# Patient Record
Sex: Female | Born: 1985 | Hispanic: No | Marital: Married | Smoking: Never smoker
Health system: Southern US, Community
[De-identification: ages and names within clinical notes are randomized; demographics above are authoritative.]

## PROBLEM LIST (undated history)

## (undated) DIAGNOSIS — L80 Vitiligo: Secondary | ICD-10-CM

## (undated) DIAGNOSIS — I10 Essential (primary) hypertension: Secondary | ICD-10-CM

## (undated) HISTORY — DX: Vitiligo: L80

---

## 2012-01-16 ENCOUNTER — Ambulatory Visit (INDEPENDENT_AMBULATORY_CARE_PROVIDER_SITE_OTHER): Payer: PRIVATE HEALTH INSURANCE | Admitting: Internal Medicine

## 2012-01-16 VITALS — BP 158/100 | HR 64 | Temp 98.0°F | Resp 16 | Ht 67.0 in | Wt 166.8 lb

## 2012-01-16 DIAGNOSIS — I10 Essential (primary) hypertension: Secondary | ICD-10-CM

## 2012-01-16 DIAGNOSIS — J029 Acute pharyngitis, unspecified: Secondary | ICD-10-CM

## 2012-01-16 MED ORDER — AZITHROMYCIN 250 MG PO TABS: ORAL_TABLET | ORAL | Status: AC

## 2012-01-16 MED ORDER — MAGIC MOUTHWASH W/LIDOCAINE: 5.0000 mL | Freq: Four times a day (QID) | ORAL | Status: DC | PRN
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
## 2012-01-19 LAB — CULTURE, GROUP A STREP: Organism ID, Bacteria: NORMAL

## 2013-08-15 ENCOUNTER — Other Ambulatory Visit: Payer: Self-pay | Admitting: Obstetrics & Gynecology

## 2013-10-10 ENCOUNTER — Other Ambulatory Visit: Payer: PRIVATE HEALTH INSURANCE | Admitting: Adult Health

## 2013-10-24 ENCOUNTER — Other Ambulatory Visit: Payer: PRIVATE HEALTH INSURANCE | Admitting: Adult Health

## 2014-08-07 ENCOUNTER — Other Ambulatory Visit: Payer: Self-pay | Admitting: Obstetrics & Gynecology

## 2020-04-14 ENCOUNTER — Emergency Department (HOSPITAL_COMMUNITY)
Admission: EM | Admit: 2020-04-14 | Discharge: 2020-04-14 | Disposition: A | Discharge status: Home / Self Care | Payer: Managed Care, Other (non HMO) | Attending: Emergency Medicine | Admitting: Emergency Medicine

## 2020-04-14 ENCOUNTER — Encounter (HOSPITAL_COMMUNITY): Payer: Self-pay | Admitting: Emergency Medicine

## 2020-04-14 ENCOUNTER — Emergency Department (HOSPITAL_COMMUNITY): Payer: Managed Care, Other (non HMO)

## 2020-04-14 ENCOUNTER — Other Ambulatory Visit: Payer: Self-pay

## 2020-04-14 DIAGNOSIS — Y999 Unspecified external cause status: Secondary | ICD-10-CM | POA: Diagnosis not present

## 2020-04-14 DIAGNOSIS — Z79899 Other long term (current) drug therapy: Secondary | ICD-10-CM | POA: Diagnosis not present

## 2020-04-14 DIAGNOSIS — M542 Cervicalgia: Secondary | ICD-10-CM | POA: Diagnosis not present

## 2020-04-14 DIAGNOSIS — Y92410 Unspecified street and highway as the place of occurrence of the external cause: Secondary | ICD-10-CM | POA: Diagnosis not present

## 2020-04-14 DIAGNOSIS — Y93I9 Activity, other involving external motion: Secondary | ICD-10-CM | POA: Diagnosis not present

## 2020-04-14 DIAGNOSIS — I10 Essential (primary) hypertension: Secondary | ICD-10-CM | POA: Diagnosis not present

## 2020-04-14 HISTORY — DX: Essential (primary) hypertension: I10

## 2020-04-14 MED ORDER — NAPROXEN 500 MG PO TABS: 500.0000 mg | ORAL_TABLET | Freq: Two times a day (BID) | ORAL | 0 refills | Status: DC

## 2020-04-14 MED ORDER — METHOCARBAMOL 500 MG PO TABS: 500.0000 mg | ORAL_TABLET | Freq: Two times a day (BID) | ORAL | 0 refills | Status: DC
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
## 2020-04-14 NOTE — Discharge Instructions (Signed)
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
## 2020-11-30 DIAGNOSIS — C4490 Unspecified malignant neoplasm of skin, unspecified: Secondary | ICD-10-CM

## 2020-11-30 HISTORY — DX: Unspecified malignant neoplasm of skin, unspecified: C44.90

## 2022-01-23 ENCOUNTER — Other Ambulatory Visit: Payer: Self-pay

## 2022-01-23 ENCOUNTER — Ambulatory Visit
Admission: EM | Admit: 2022-01-23 | Discharge: 2022-01-23 | Disposition: A | Discharge status: Home / Self Care | Payer: Commercial Managed Care - PPO | Attending: Family Medicine | Admitting: Family Medicine

## 2022-01-23 ENCOUNTER — Encounter: Payer: Self-pay | Admitting: Emergency Medicine

## 2022-01-23 DIAGNOSIS — J029 Acute pharyngitis, unspecified: Secondary | ICD-10-CM | POA: Diagnosis not present

## 2022-01-23 DIAGNOSIS — R5383 Other fatigue: Secondary | ICD-10-CM | POA: Diagnosis present

## 2022-01-23 LAB — POCT RAPID STREP A (OFFICE): Rapid Strep A Screen: NEGATIVE

## 2022-01-23 MED ORDER — LIDOCAINE VISCOUS HCL 2 % MT SOLN: 10.0000 mL | OROMUCOSAL | 0 refills | Status: DC | PRN

## 2022-01-23 NOTE — ED Provider Notes (Signed)
[facility]    CSN: [id number] Arrival date & time: 01/23/22  0831      History   Chief Complaint No chief complaint on file.   HPI Haley Skinner is a 36 y.o. female.   Presenting today with 3 to 4-day history of sore, swollen feeling throat, fatigue, nasal drainage and also saw white spots in the back of her throat since yesterday.  Denies fever, chills, dysphagia, cough, chest pain, shortness of breath, abdominal pain, nausea vomiting or diarrhea.  So far trying DayQuil, Flonase, ibuprofen with minimal temporary relief of symptoms.  Multiple sick contacts recently but unsure with what.  Had COVID about 3 weeks ago.  No known pertinent chronic medical problems.   Past Medical History:  Diagnosis Date   Hypertension     There are no problems to display for this patient.   Past Surgical History:  Procedure Laterality Date   CESAREAN SECTION      OB History   No obstetric history on file.      Home Medications    Prior to Admission medications   Medication Sig Start Date End Date Taking? Authorizing Provider  labetalol (NORMODYNE) 100 MG tablet Take 100 mg by mouth 2 (two) times daily.   Yes [provider]  lidocaine (XYLOCAINE) 2 % solution Use as directed 10 mLs in the mouth or throat every 3 (three) hours as needed for mouth pain. 01/23/22  Yes Volney American, PA-C  montelukast (SINGULAIR) 10 MG tablet Take 10 mg by mouth at bedtime.   Yes [provider]  Alum & Mag Hydroxide-Simeth (MAGIC MOUTHWASH W/LIDOCAINE) SOLN Take 5 mLs by mouth 4 (four) times daily as needed. 01/16/12   Dunn, Areta Haber, PA-C  methocarbamol (ROBAXIN) 500 MG tablet Take 1 tablet (500 mg total) by mouth 2 (two) times daily. 04/14/20   Jacqlyn Larsen, PA-C  naproxen (NAPROSYN) 500 MG tablet Take 1 tablet (500 mg total) by mouth 2 (two) times daily. 04/14/20   Jacqlyn Larsen, PA-C  Norgestimate-Ethinyl Estradiol Triphasic 0.18/0.215/0.25 MG-25 MCG tab Take 1  tablet by mouth daily.    [provider]  [place] 28 0.25-35 MG-MCG tablet TAKE BY MOUTH AS DIRECTED 08/08/14   Florian Buff, MD    Family History History reviewed. No pertinent family history.  Social History Social History   Tobacco Use   Smoking status: Never   Smokeless tobacco: Never  Substance Use Topics   Alcohol use: Yes    Comment: socially   Drug use: Never     Allergies   Amoxicillin   Review of Systems Review of Systems Per HPI  Physical Exam Triage Vital Signs ED Triage Vitals  Enc Vitals Group     BP 01/23/22 0837 130/81     Pulse Rate 01/23/22 0837 77     Resp 01/23/22 0837 18     Temp 01/23/22 0837 98.2 F (36.8 C)     Temp Source 01/23/22 0837 Oral     SpO2 01/23/22 0837 98 %     Weight --      Height --      Head Circumference --      Peak Flow --      Pain Score 01/23/22 0838 5     Pain Loc --      Pain Edu? --      Excl. in [place]? --    No data found.  Updated Vital Signs BP 130/81 (BP Location: Right  Arm)    Pulse 77    Temp 98.2 F (36.8 C) (Oral)    Resp 18    LMP 01/08/2022 (Exact Date)    SpO2 98%   Visual Acuity Right Eye Distance:   Left Eye Distance:   Bilateral Distance:    Right Eye Near:   Left Eye Near:    Bilateral Near:     Physical Exam Vitals and nursing note reviewed.  Constitutional:      Appearance: Normal appearance.  HENT:     Head: Atraumatic.     Right Ear: Tympanic membrane and external ear normal.     Left Ear: Tympanic membrane and external ear normal.     Nose: Nose normal.     Mouth/Throat:     Mouth: Mucous membranes are moist.     Pharynx: Posterior oropharyngeal erythema present. No oropharyngeal exudate.     Comments: Mild oropharyngeal edema, erythema. No obvious exudates. Uvula midline. Oral airway patent Eyes:     Extraocular Movements: Extraocular movements intact.     Conjunctiva/sclera: Conjunctivae normal.  Cardiovascular:     Rate and Rhythm: Normal rate and regular  rhythm.     Heart sounds: Normal heart sounds.  Pulmonary:     Effort: Pulmonary effort is normal.     Breath sounds: Normal breath sounds. No wheezing.  Musculoskeletal:        General: Normal range of motion.     Cervical back: Normal range of motion and neck supple.  Skin:    General: Skin is warm and dry.  Neurological:     Mental Status: She is alert and oriented to person, place, and time.  Psychiatric:        Mood and Affect: Mood normal.        Thought Content: Thought content normal.   UC Treatments / Results  Labs (all labs ordered are listed, but only abnormal results are displayed) Labs Reviewed  CULTURE, GROUP A STREP [facility])  POCT RAPID STREP A (OFFICE)    EKG   Radiology No results found.  Procedures Procedures (including critical care time)  Medications Ordered in UC Medications - No data to display  Initial Impression / Assessment and Plan / UC Course  I have reviewed the triage vital signs and the nursing notes.  Pertinent labs & imaging results that were available during my care of the patient were reviewed by me and considered in my medical decision making (see chart for details).     Vitals and exam overall reassuring and suspicious for a viral pharyngitis.  Rapid strep negative, throat culture pending.  She was viscous lidocaine, supportive over-the-counter medications and home care.  Work note given.  Return for any acutely worsening symptoms.  Final Clinical Impressions(s) / UC Diagnoses   Final diagnoses:  Sore throat  Other fatigue   Discharge Instructions   None    ED Prescriptions     Medication Sig Dispense Auth. Provider   lidocaine (XYLOCAINE) 2 % solution Use as directed 10 mLs in the mouth or throat every 3 (three) hours as needed for mouth pain. 100 mL Volney American, [place]      PDMP not reviewed this encounter.   Merrie Roof [place], [place] 01/23/22 [phone]

## 2022-01-23 NOTE — ED Triage Notes (Signed)
Sore throat since Tuesday.  Fatigue and some nasal drainage.  States she saw white spots on back of throat.

## 2022-01-26 LAB — CULTURE, GROUP A STREP (THRC)

## 2022-06-04 ENCOUNTER — Encounter: Payer: Self-pay | Admitting: Obstetrics & Gynecology

## 2022-06-04 ENCOUNTER — Ambulatory Visit (INDEPENDENT_AMBULATORY_CARE_PROVIDER_SITE_OTHER): Payer: Commercial Managed Care - PPO | Admitting: Obstetrics & Gynecology

## 2022-06-04 VITALS — Ht 66.0 in | Wt 183.0 lb

## 2022-06-04 DIAGNOSIS — N920 Excessive and frequent menstruation with regular cycle: Secondary | ICD-10-CM

## 2022-06-04 DIAGNOSIS — N946 Dysmenorrhea, unspecified: Secondary | ICD-10-CM

## 2022-06-04 NOTE — Progress Notes (Signed)
Chief Complaint  Patient presents with   Heavy bleeding with clots      36 y.o. G2P0010 Patient's last menstrual period was 05/19/2022. The current method of family planning is none.  Outpatient Encounter Medications as of 06/04/2022  Medication Sig   labetalol (NORMODYNE) 200 MG tablet Take 200 mg by mouth 2 (two) times daily.   montelukast (SINGULAIR) 10 MG tablet Take 10 mg by mouth at bedtime.   lidocaine (XYLOCAINE) 2 % solution Use as directed 10 mLs in the mouth or throat every 3 (three) hours as needed for mouth pain. (Patient not taking: Reported on 06/04/2022)   naproxen (NAPROSYN) 500 MG tablet Take 1 tablet (500 mg total) by mouth 2 (two) times daily. (Patient not taking: Reported on 06/04/2022)   [DISCONTINUED] Alum & Mag Hydroxide-Simeth (MAGIC MOUTHWASH W/LIDOCAINE) SOLN Take 5 mLs by mouth 4 (four) times daily as needed. (Patient not taking: Reported on 06/04/2022)   [DISCONTINUED] labetalol (NORMODYNE) 100 MG tablet Take 100 mg by mouth 2 (two) times daily. (Patient not taking: Reported on 06/04/2022)   [DISCONTINUED] methocarbamol (ROBAXIN) 500 MG tablet Take 1 tablet (500 mg total) by mouth 2 (two) times daily. (Patient not taking: Reported on 06/04/2022)   [DISCONTINUED] Norgestimate-Ethinyl Estradiol Triphasic 0.18/0.215/0.25 MG-25 MCG tab Take 1 tablet by mouth daily. (Patient not taking: Reported on 06/04/2022)   [DISCONTINUED] SPRINTEC 28 0.25-35 MG-MCG tablet TAKE BY MOUTH AS DIRECTED (Patient not taking: Reported on 06/04/2022)   No facility-administered encounter medications on file as of 06/04/2022.    Subjective Pt with increasingly worsening periods, both in volume and disruptive cramping Still lasts the same time but >50% volume and cramping as compared baseline No other associated issues Past Medical History:  Diagnosis Date   Hypertension    Skin cancer 2022   basal cell, actinic keratosis   Vitiligo     Past Surgical History:  Procedure Laterality Date    CESAREAN SECTION      OB History     Gravida  2   Para      Term      Preterm      AB  1   Living         SAB  1   IAB      Ectopic      Multiple      Live Births              Allergies  Allergen Reactions   Amoxicillin Hives    Social History   Socioeconomic History   Marital status: Married    Spouse name: Not on file   Number of children: Not on file   Years of education: Not on file   Highest education level: Not on file  Occupational History   Not on file  Tobacco Use   Smoking status: Never   Smokeless tobacco: Never  Vaping Use   Vaping Use: Never used  Substance and Sexual Activity   Alcohol use: Yes    Comment: socially   Drug use: Never   Sexual activity: Yes    Birth control/protection: None, Condom  Other Topics Concern   Not on file  Social History Narrative   Not on file   Social Determinants of Health   Financial Resource Strain: Low Risk  (06/04/2022)   Overall Financial Resource Strain (CARDIA)    Difficulty of Paying Living Expenses: Not very hard  Food Insecurity: No Food Insecurity (06/04/2022)   Hunger Vital  Sign    Worried About Charity fundraiser in the Last Year: Never true    [place] in the Last Year: Never true  Transportation Needs: No Transportation Needs (06/04/2022)   PRAPARE - Hydrologist (Medical): No    Lack of Transportation (Non-Medical): No  Physical Activity: Sufficiently Active (06/04/2022)   Exercise Vital Sign    Days of Exercise per Week: 3 days    Minutes of Exercise per Session: 60 min  Stress: No Stress Concern Present (06/04/2022)   [place]    Feeling of Stress : Only a little  Social Connections: Socially Integrated (06/04/2022)   Social Connection and Isolation Panel [NHANES]    Frequency of Communication with Friends and Family: Twice a week    Frequency of Social Gatherings with  Friends and Family: Twice a week    Attends Religious Services: More than 4 times per year    Active Member of [employer] or Organizations: Yes    Attends Archivist Meetings: 1 to 4 times per year    Marital Status: Married    Family History  Problem Relation Age of Onset   Hypertension Paternal Grandfather    Alcoholism Paternal Grandfather    Alcoholism Paternal Grandmother    Diabetes Maternal Grandmother    Liver disease Maternal Grandmother    Bladder Cancer Maternal Grandfather    Hypertension Father    Hyperlipidemia Father    Hyperlipidemia Mother    Vasculitis Mother    Gestational diabetes Mother     Medications:       Current Outpatient Medications:    labetalol (NORMODYNE) 200 MG tablet, Take 200 mg by mouth 2 (two) times daily., Disp: , Rfl:    montelukast (SINGULAIR) 10 MG tablet, Take 10 mg by mouth at bedtime., Disp: , Rfl:    lidocaine (XYLOCAINE) 2 % solution, Use as directed 10 mLs in the mouth or throat every 3 (three) hours as needed for mouth pain. (Patient not taking: Reported on 06/04/2022), Disp: 100 mL, Rfl: 0   naproxen (NAPROSYN) 500 MG tablet, Take 1 tablet (500 mg total) by mouth 2 (two) times daily. (Patient not taking: Reported on 06/04/2022), Disp: 30 tablet, Rfl: 0  Objective Height '5\' 6"'$  (1.676 m), weight 183 lb (83 kg), last menstrual period 05/19/2022.  General WDWN female NAD Vulva:  normal appearing vulva with no masses, tenderness or lesions Vagina:  normal mucosa, no discharge Cervix:  Normal no lesions Uterus:  normal size, contour, position, consistency, mobility, non-tender Adnexa: ovaries:present,  normal adnexa in size, nontender and no masses   Pertinent ROS No burning with urination, frequency or urgency No nausea, vomiting or diarrhea Nor fever chills or other constitutional symptoms   Labs or studies     Impression + Management Plan: Diagnoses this Encounter::   ICD-10-CM   1. Menorrhagia with regular cycle   N92.0 POCT hemoglobin   worsened over the past <12 months, large clots requiring tampons pads and soils, hemoglobin 13, POP pills for cycle management    2. Dysmenorrhea  N94.6         Medications prescribed during  this encounter: No orders of the defined types were placed in this encounter.   Labs or Scans Ordered during this encounter: Orders Placed This Encounter  Procedures   POCT hemoglobin      Follow up Return if symptoms worsen or fail to improve, for  contact regarding response.

## 2022-06-05 LAB — POCT HEMOGLOBIN: Hemoglobin: 13 g/dL (ref 11–14.6)

## 2022-07-16 ENCOUNTER — Encounter: Payer: Self-pay | Admitting: Obstetrics & Gynecology

## 2022-08-24 ENCOUNTER — Telehealth: Payer: Self-pay | Admitting: *Deleted

## 2022-08-24 MED ORDER — SLYND 4 MG PO TABS
1.0000 | ORAL_TABLET | Freq: Every day | ORAL | 12 refills | Status: DC
Start: 2022-08-24 — End: 2022-12-01

## 2022-08-24 NOTE — Telephone Encounter (Signed)
Pt needs a prescription of Slynd sent to pharmacy. Thanks! Gratz

## 2022-09-15 ENCOUNTER — Telehealth: Payer: Self-pay | Admitting: *Deleted

## 2022-09-15 MED ORDER — SLYND 4 MG PO TABS: 1.0000 | ORAL_TABLET | Freq: Every day | ORAL | 12 refills | Status: DC

## 2022-09-15 NOTE — Telephone Encounter (Signed)
Slynd is not covered by patients insurance. Covered options are Camila, Errin, Heather, Norethindrone, [place]. Please send new rx.

## 2022-11-08 ENCOUNTER — Encounter: Payer: Self-pay | Admitting: Obstetrics & Gynecology

## 2022-11-26 ENCOUNTER — Other Ambulatory Visit: Payer: Self-pay | Admitting: Obstetrics & Gynecology

## 2022-11-26 DIAGNOSIS — N921 Excessive and frequent menstruation with irregular cycle: Secondary | ICD-10-CM

## 2022-12-01 ENCOUNTER — Encounter: Payer: Self-pay | Admitting: Obstetrics & Gynecology

## 2022-12-01 ENCOUNTER — Ambulatory Visit (INDEPENDENT_AMBULATORY_CARE_PROVIDER_SITE_OTHER): Payer: Commercial Managed Care - PPO

## 2022-12-01 ENCOUNTER — Ambulatory Visit (INDEPENDENT_AMBULATORY_CARE_PROVIDER_SITE_OTHER): Payer: Commercial Managed Care - PPO | Admitting: Obstetrics & Gynecology

## 2022-12-01 VITALS — BP 144/92 | HR 67 | Ht 66.0 in | Wt 183.5 lb

## 2022-12-01 DIAGNOSIS — N946 Dysmenorrhea, unspecified: Secondary | ICD-10-CM | POA: Diagnosis not present

## 2022-12-01 DIAGNOSIS — N920 Excessive and frequent menstruation with regular cycle: Secondary | ICD-10-CM | POA: Diagnosis not present

## 2022-12-01 DIAGNOSIS — N921 Excessive and frequent menstruation with irregular cycle: Secondary | ICD-10-CM | POA: Diagnosis not present

## 2022-12-01 MED ORDER — TRANEXAMIC ACID 650 MG PO TABS: 1300.0000 mg | ORAL_TABLET | Freq: Three times a day (TID) | ORAL | 11 refills | Status: DC

## 2022-12-01 NOTE — Progress Notes (Signed)
PELVIC US TA/TV: homogeneous anteverted uterus,WNL,EEC 8 mm,normal right ovary,(#1)simple unilocular left ovarian cyst 2.5 x 2.4 x 1.9 cm,(#2) small hemorrhagic cyst 1.7 x 1.4 x 1.3 cm,ovaries appear mobile,no free fluid,left adnexal discomfort during ultrasound  Chaperone Peggy

## 2022-12-01 NOTE — Progress Notes (Signed)
Follow up appointment for results: Sonogram, repsonse to slynd(POP)  Chief Complaint  Patient presents with   Follow-up    Discuss [place] results    Blood pressure (!) 144/92, pulse 67, height '5\' 6"'$  (1.676 m), weight 183 lb 8 oz (83.2 kg), last menstrual period 11/28/2022.  US PELVIC COMPLETE WITH TRANSVAGINAL  Result Date: 12/01/2022  .Marland Kitchenan Sales executive of Ultrasound Medicine Diplomatic Services operational officer) accredited practice Center for [facility] @ [address] [place] [address] Ordering Provider: Florian Buff, MD                                                                                                                                   GYNECOLOGIC SONOGRAM Haley Skinner is a 37 y.o. G2P0010 No LMP recorded. for a pelvic sonogram for menorrhagia. Uterus                      7.2 x 4.2 x 6 cm, Total uterine volume 94 cc, homogeneous anteverted uterus,WNL Endometrium          8 mm, symmetrical, wnl Right ovary             2.7 x 1.1 x 2.1 cm, wnl Left ovary                3.1 x 3.8 x 3.3 cm, (#1)simple unilocular left ovarian cyst 2.5 x 2.4 x 1.9 cm,(#2) small hemorrhagic lt ov cyst 1.7 x 1.4 x 1.3 cm No free fluid Technician Comments: PELVIC US TA/TV: homogeneous anteverted uterus,WNL,EEC 8 mm,normal right ovary,(#1)simple unilocular left ovarian cyst 2.5 x 2.4 x 1.9 cm,(#2) small hemorrhagic left ovarian cyst 1.7 x 1.4 x 1.3 cm,ovaries appear mobile,no free fluid,left adnexal discomfort during ultrasound Chaperone [address] Heide Guile 12/01/2022 9:54 AM Clinical Impression and recommendations: I have reviewed the sonogram results above, combined with the patient's current clinical course, below are my impressions and any appropriate recommendations for management based on the sonographic findings. Uterus normal size shape and contour Endometrium thin symmetrical no pathology Ovaries: physiological cysts noted, otherwise, benign, normal size shape and morpholog Normal Pelvic sonogram  Florian Buff 12/01/2022 10:34 AM     3 weeks bleeding with slynd + progestational side effects Has significant BP issues so COC not an option I do not think Discussed IUD(does not want), ablation(would like to defer if possible) no other POP/only method would be acceptable given her response to slynd  Will try lysteda + naproxen sodium at the time of cycle  MEDS ordered this encounter: Meds ordered this encounter  Medications   tranexamic acid (LYSTEDA) 650 MG TABS tablet    Sig: Take 2 tablets (1,300 mg total) by mouth 3 (three) times daily.    Dispense:  30 tablet    Refill:  11    Orders for this encounter: No orders of the defined types were placed in this encounter.  Impression + Management Plan   ICD-10-CM   1. Menorrhagia with regular cycle  N92.0     2. Dysmenorrhea  N94.6       Follow Up: Return if symptoms worsen or fail to improve, for Haley Skinner is in her court, send me MyChart with response.     All questions were answered.  Past Medical History:  Diagnosis Date   Hypertension    Skin cancer 2022   basal cell, actinic keratosis   Vitiligo     Past Surgical History:  Procedure Laterality Date   CESAREAN SECTION      OB History     Gravida  2   Para  1   Term  1   Preterm      AB  1   Living  1      SAB  1   IAB      Ectopic      Multiple      Live Births  1           Allergies  Allergen Reactions   Amoxicillin Hives    Social History   Socioeconomic History   Marital status: Married    Spouse name: Not on file   Number of children: Not on file   Years of education: Not on file   Highest education level: Not on file  Occupational History   Not on file  Tobacco Use   Smoking status: Never   Smokeless tobacco: Never  Vaping Use   Vaping Use: Never used  Substance and Sexual Activity   Alcohol use: Yes    Comment: socially   Drug use: Never   Sexual activity: Yes    Birth control/protection: None, Condom   Other Topics Concern   Not on file  Social History Narrative   Not on file   Social Determinants of Health   Financial Resource Strain: Low Risk  (06/04/2022)   Overall Financial Resource Strain (CARDIA)    Difficulty of Paying Living Expenses: Not very hard  Food Insecurity: No Food Insecurity (06/04/2022)   Hunger Vital Sign    Worried About Running Out of Food in the Last Year: Never true    Ran Out of Food in the Last Year: Never true  Transportation Needs: No Transportation Needs (06/04/2022)   PRAPARE - Hydrologist (Medical): No    Lack of Transportation (Non-Medical): No  Physical Activity: Sufficiently Active (06/04/2022)   Exercise Vital Sign    Days of Exercise per Week: 3 days    Minutes of Exercise per Session: 60 min  Stress: No Stress Concern Present (06/04/2022)   [place]    Feeling of Stress : Only a little  Social Connections: Socially Integrated (06/04/2022)   Social Connection and Isolation Panel [NHANES]    Frequency of Communication with Friends and Family: Twice a week    Frequency of Social Gatherings with Friends and Family: Twice a week    Attends Religious Services: More than 4 times per year    Active Member of [employer] or Organizations: Yes    Attends Archivist Meetings: 1 to 4 times per year    Marital Status: Married    Family History  Problem Relation Age of Onset   Hypertension Paternal Grandfather    Alcoholism Paternal Grandfather    Alcoholism Paternal Grandmother    Diabetes Maternal Grandmother    Liver disease Maternal Grandmother  Bladder Cancer Maternal Grandfather    Hypertension Father    Hyperlipidemia Father    Hyperlipidemia Mother    Vasculitis Mother    Gestational diabetes Mother

## 2023-05-11 ENCOUNTER — Emergency Department (HOSPITAL_COMMUNITY)
Admission: EM | Admit: 2023-05-11 | Discharge: 2023-05-12 | Disposition: A | Discharge status: Home / Self Care | Payer: Commercial Managed Care - PPO | Attending: Emergency Medicine | Admitting: Emergency Medicine

## 2023-05-11 ENCOUNTER — Encounter (HOSPITAL_COMMUNITY): Payer: Self-pay

## 2023-05-11 ENCOUNTER — Other Ambulatory Visit: Payer: Self-pay

## 2023-05-11 ENCOUNTER — Emergency Department (HOSPITAL_COMMUNITY): Payer: Commercial Managed Care - PPO

## 2023-05-11 DIAGNOSIS — N12 Tubulo-interstitial nephritis, not specified as acute or chronic: Secondary | ICD-10-CM | POA: Insufficient documentation

## 2023-05-11 DIAGNOSIS — I10 Essential (primary) hypertension: Secondary | ICD-10-CM | POA: Insufficient documentation

## 2023-05-11 DIAGNOSIS — Z85828 Personal history of other malignant neoplasm of skin: Secondary | ICD-10-CM | POA: Insufficient documentation

## 2023-05-11 DIAGNOSIS — R109 Unspecified abdominal pain: Secondary | ICD-10-CM | POA: Diagnosis present

## 2023-05-11 LAB — CBC
HCT: 38.1 % (ref 36.0–46.0)
Hemoglobin: 13.5 g/dL (ref 12.0–15.0)
MCH: 30.3 pg (ref 26.0–34.0)
MCHC: 35.4 g/dL (ref 30.0–36.0)
MCV: 85.4 fL (ref 80.0–100.0)
Platelets: 172 10*3/uL (ref 150–400)
RBC: 4.46 MIL/uL (ref 3.87–5.11)
RDW: 12.2 % (ref 11.5–15.5)
WBC: 10.9 10*3/uL — ABNORMAL HIGH (ref 4.0–10.5)
nRBC: 0 % (ref 0.0–0.2)

## 2023-05-11 LAB — COMPREHENSIVE METABOLIC PANEL
ALT: 17 U/L (ref 0–44)
AST: 17 U/L (ref 15–41)
Albumin: 4.2 g/dL (ref 3.5–5.0)
Alkaline Phosphatase: 54 U/L (ref 38–126)
Anion gap: 7 (ref 5–15)
BUN: 14 mg/dL (ref 6–20)
CO2: 25 mmol/L (ref 22–32)
Calcium: 8.9 mg/dL (ref 8.9–10.3)
Chloride: 102 mmol/L (ref 98–111)
Creatinine, Ser: 0.79 mg/dL (ref 0.44–1.00)
GFR, Estimated: >60 mL/min (ref >60)
Glucose, Bld: 116 mg/dL — ABNORMAL HIGH (ref 70–99)
Potassium: 3.6 mmol/L (ref 3.5–5.1)
Sodium: 134 mmol/L — ABNORMAL LOW (ref 135–145)
Total Bilirubin: 0.5 mg/dL (ref 0.3–1.2)
Total Protein: 7.1 g/dL (ref 6.5–8.1)

## 2023-05-11 LAB — POC URINE PREG, ED: Preg Test, Ur: NEGATIVE

## 2023-05-11 LAB — LIPASE, BLOOD: Lipase: 46 U/L (ref 11–51)

## 2023-05-11 MED ORDER — IOHEXOL 300 MG/ML  SOLN
100.0000 mL | Freq: Once | INTRAMUSCULAR | Status: AC | PRN
  Administered 2023-05-12: 100 mL via INTRAVENOUS

## 2023-05-11 NOTE — ED Triage Notes (Signed)
Pt complaining of right flank pain x1 day that has gotten worse. Pt endorses nausea. Pt has had 2 recent UTI's in the last 5 weeks. Denies fevers.

## 2023-05-12 LAB — URINALYSIS, ROUTINE W REFLEX MICROSCOPIC
Bilirubin Urine: NEGATIVE
Glucose, UA: NEGATIVE mg/dL
Ketones, ur: NEGATIVE mg/dL
Nitrite: NEGATIVE
Protein, ur: 100 mg/dL — AB
RBC / HPF: >50 RBC/hpf (ref 0–5)
Specific Gravity, Urine: 1.012 (ref 1.005–1.030)
WBC, UA: >50 WBC/hpf (ref 0–5)
pH: 7 (ref 5.0–8.0)

## 2023-05-12 MED ORDER — CIPROFLOXACIN HCL 250 MG PO TABS
500.0000 mg | ORAL_TABLET | Freq: Once | ORAL | Status: AC
  Administered 2023-05-12: 500 mg via ORAL
  Filled 2023-05-12: qty 2

## 2023-05-12 MED ORDER — CIPROFLOXACIN HCL 500 MG PO TABS: 500.0000 mg | ORAL_TABLET | Freq: Two times a day (BID) | ORAL | 0 refills | Status: AC

## 2023-05-12 NOTE — ED Provider Notes (Signed)
[facility]-EMERGENCY DEPT [facility] Emergency Department Provider Note MRN:  [id number]  Arrival date & time: 05/12/23     Chief Complaint   Flank pain History of Present Illness   Haley Skinner is a 37 y.o. year-old female with a history of hypertension presenting to the ED with chief complaint of flank pain.  Pain in the right flank for the past few days.  Has had multiple UTIs over the past few weeks.  Denies fever, pain was pretty significant at home but improved with Motrin.  Review of Systems  A thorough review of systems was obtained and all systems are negative except as noted in the HPI and PMH.   Patient's Health History    Past Medical History:  Diagnosis Date   Hypertension    Skin cancer 2022   basal cell, actinic keratosis   Vitiligo     Past Surgical History:  Procedure Laterality Date   CESAREAN SECTION      Family History  Problem Relation Age of Onset   Hypertension Paternal Grandfather    Alcoholism Paternal Grandfather    Alcoholism Paternal Grandmother    Diabetes Maternal Grandmother    Liver disease Maternal Grandmother    Bladder Cancer Maternal Grandfather    Hypertension Father    Hyperlipidemia Father    Hyperlipidemia Mother    Vasculitis Mother    Gestational diabetes Mother     Social History   Socioeconomic History   Marital status: Married    Spouse name: Not on file   Number of children: Not on file   Years of education: Not on file   Highest education level: Not on file  Occupational History   Not on file  Tobacco Use   Smoking status: Never   Smokeless tobacco: Never  Vaping Use   Vaping Use: Never used  Substance and Sexual Activity   Alcohol use: Yes    Comment: socially   Drug use: Never   Sexual activity: Yes    Birth control/protection: None, Condom  Other Topics Concern   Not on file  Social History Narrative   Not on file   Social Determinants of Health   Financial Resource Strain: Low Risk   (06/04/2022)   Overall Financial Resource Strain (CARDIA)    Difficulty of Paying Living Expenses: Not very hard  Food Insecurity: No Food Insecurity (06/04/2022)   Hunger Vital Sign    Worried About Running Out of Food in the Last Year: Never true    Ran Out of Food in the Last Year: Never true  Transportation Needs: No Transportation Needs (06/04/2022)   PRAPARE - Administrator, Civil Service (Medical): No    Lack of Transportation (Non-Medical): No  Physical Activity: Sufficiently Active (06/04/2022)   Exercise Vital Sign    Days of Exercise per Week: 3 days    Minutes of Exercise per Session: 60 min  Stress: No Stress Concern Present (06/04/2022)   [employer] of Occupational Health - Occupational Stress Questionnaire    Feeling of Stress : Only a little  Social Connections: Socially Integrated (06/04/2022)   Social Connection and Isolation Panel [NHANES]    Frequency of Communication with Friends and Family: Twice a week    Frequency of Social Gatherings with Friends and Family: Twice a week    Attends Religious Services: More than 4 times per year    Active Member of [employer] or Organizations: Yes    Attends Banker Meetings: 1 to  4 times per year    Marital Status: Married  Catering manager Violence: Not At Risk (06/04/2022)   Humiliation, Afraid, Rape, and Kick questionnaire    Fear of Current or Ex-Partner: No    Emotionally Abused: No    Physically Abused: No    Sexually Abused: No     Physical Exam   Vitals:   05/11/23 2246 05/11/23 2316  BP: 138/80   Pulse: (!) 55 67  Resp: 14   Temp: 98.4 F (36.9 C)   SpO2: 100% 97%    CONSTITUTIONAL: Well-appearing, NAD NEURO/PSYCH:  Alert and oriented x 3, no focal deficits EYES:  eyes equal and reactive ENT/NECK:  no LAD, no JVD CARDIO: Regular rate, well-perfused, normal S1 and S2 PULM:  CTAB no wheezing or rhonchi GI/GU:  non-distended, non-tender MSK/SPINE:  No gross deformities, no edema SKIN:   no rash, atraumatic   *Additional and/or pertinent findings included in MDM below  Diagnostic and Interventional Summary    EKG Interpretation  Date/Time:    Ventricular Rate:    PR Interval:    QRS Duration:   QT Interval:    QTC Calculation:   R Axis:     Text Interpretation:         Labs Reviewed  COMPREHENSIVE METABOLIC PANEL - Abnormal; Notable for the following components:      Result Value   Sodium 134 (*)    Glucose, Bld 116 (*)    All other components within normal limits  CBC - Abnormal; Notable for the following components:   WBC 10.9 (*)    All other components within normal limits  URINALYSIS, ROUTINE W REFLEX MICROSCOPIC - Abnormal; Notable for the following components:   APPearance CLOUDY (*)    Hgb urine dipstick MODERATE (*)    Protein, ur 100 (*)    Leukocytes,Ua LARGE (*)    Bacteria, UA FEW (*)    All other components within normal limits  URINE CULTURE  LIPASE, BLOOD  POC URINE PREG, ED    CT ABDOMEN PELVIS W CONTRAST  Final Result      Medications  ciprofloxacin (CIPRO) tablet 500 mg (has no administration in time range)  iohexol (OMNIPAQUE) 300 MG/ML solution 100 mL (100 mLs Intravenous Contrast Given 05/12/23 0009)     Procedures  /  Critical Care Procedures  ED Course and Medical Decision Making  Initial Impression and Ddx Differential diagnosis includes pyelonephritis, kidney stone, renal abscess, bladder outlet obstruction  Past medical/surgical history that increases complexity of ED encounter: None  Interpretation of Diagnostics I personally reviewed the laboratory assessment and my interpretation is as follows: No significant blood count or electrolyte disturbance.  Urinalysis suspicious for infection  CT imaging revealing likely pyelonephritis without other complications.  Patient Reassessment and Ultimate Disposition/Management     Patient continues to look and feel well, vitals normal, pain well-controlled.  She  initially was given Macrobid for her UTI.  For her second UTI she was given Keflex.  Given failure on both counts, will escalate treatment to ciprofloxacin.  Urine sent for culture, no culture data to date.  Patient management required discussion with the following services or consulting groups:  None  Complexity of Problems Addressed Acute illness or injury that poses threat of life of bodily function  Additional Data Reviewed and Analyzed Further history obtained from: Further history from spouse/family member  Additional Factors Impacting ED Encounter Risk Prescriptions  Elmer Sow. Pilar Plate, MD [facility] Emergency Medicine [email or web address]  [email or web address]  Final Clinical Impressions(s) / ED Diagnoses     ICD-10-CM   1. Pyelonephritis  N12       ED Discharge Orders          Ordered    ciprofloxacin (CIPRO) 500 MG tablet  Every 12 hours        05/12/23 0046             Discharge Instructions Discussed with and Provided to Patient:    Discharge Instructions      You were evaluated in the Emergency Department and after careful evaluation, we did not find any emergent condition requiring admission or further testing in the hospital.  Your exam/testing today is overall reassuring.  Symptoms seem to be due to a kidney infection.  Take the ciprofloxacin antibiotic as directed.  Continue Tylenol or ibuprofen at home for discomfort.  Please return to the Emergency Department if you experience any worsening of your condition.   Thank you for allowing [place] to be a part of your care.      Sabas Sous, MD 05/12/23 [phone]

## 2023-05-12 NOTE — Discharge Instructions (Signed)
You were evaluated in the Emergency Department and after careful evaluation, we did not find any emergent condition requiring admission or further testing in the hospital.  Your exam/testing today is overall reassuring.  Symptoms seem to be due to a kidney infection.  Take the ciprofloxacin antibiotic as directed.  Continue Tylenol or ibuprofen at home for discomfort.  Please return to the Emergency Department if you experience any worsening of your condition.   Thank you for allowing [place] to be a part of your care.

## 2023-05-13 LAB — URINE CULTURE: Culture: 90000 — AB

## 2023-05-14 LAB — URINE CULTURE

## 2023-05-15 ENCOUNTER — Telehealth (HOSPITAL_BASED_OUTPATIENT_CLINIC_OR_DEPARTMENT_OTHER): Payer: Self-pay

## 2023-05-15 NOTE — Telephone Encounter (Signed)
Post ED Visit - Positive Culture Follow-up  Culture report reviewed by antimicrobial stewardship pharmacist: [facility] [x]  Estill Batten, Pharm.D. BCCCP []  Celedonio Miyamoto, Pharm.D., BCPS AQ-ID []  Garvin Fila, Pharm.D., BCPS []  Georgina Pillion, [address].D., BCPS []  [place], [place].D., BCPS, AAHIVP []  Estella Husk, Pharm.D., BCPS, AAHIVP []  Lysle Pearl, PharmD, BCPS []  Phillips Climes, PharmD, BCPS []  Agapito Games, PharmD, BCPS []  Verlan Friends, PharmD []  Mervyn Gay, PharmD, BCPS []  Vinnie Level, PharmD  [facility] []  Len Childs, PharmD []  Greer Pickerel, PharmD []  Adalberto Cole, PharmD []  Perlie Gold, Rph []  Lonell Face) Jean Rosenthal, PharmD []  Earl Many, PharmD []  Junita Push, PharmD []  Dorna Leitz, PharmD []  Terrilee Files, PharmD []  Lynann Beaver, PharmD []  Keturah Barre, PharmD []  Loralee Pacas, PharmD []  Bernadene Person, PharmD   Positive urine culture Treated with Ciprofloxacin, organism sensitive to the same and no further patient follow-up is required at this time.  Sandria Senter 05/15/2023, 1:31 PM

## 2023-05-18 ENCOUNTER — Encounter: Payer: Self-pay | Admitting: Obstetrics & Gynecology

## 2023-07-07 ENCOUNTER — Ambulatory Visit: Payer: Commercial Managed Care - PPO | Admitting: Urology

## 2023-07-07 VITALS — BP 162/88 | HR 65 | Ht 66.0 in | Wt 172.2 lb

## 2023-07-07 DIAGNOSIS — R350 Frequency of micturition: Secondary | ICD-10-CM

## 2023-07-07 DIAGNOSIS — N390 Urinary tract infection, site not specified: Secondary | ICD-10-CM | POA: Diagnosis not present

## 2023-07-07 LAB — URINALYSIS, COMPLETE
Bilirubin, UA: NEGATIVE
Glucose, UA: NEGATIVE
Ketones, UA: NEGATIVE
Leukocytes,UA: NEGATIVE
Nitrite, UA: NEGATIVE
Protein,UA: NEGATIVE
RBC, UA: NEGATIVE
Specific Gravity, UA: <1.005 — ABNORMAL LOW (ref 1.005–1.030)
Urobilinogen, Ur: 0.2 mg/dL (ref 0.2–1.0)
pH, UA: 5.5 (ref 5.0–7.5)

## 2023-07-07 LAB — MICROSCOPIC EXAMINATION
Bacteria, UA: NONE SEEN
RBC, Urine: NONE SEEN /hpf (ref 0–2)

## 2023-07-07 LAB — BLADDER SCAN AMB NON-IMAGING: Scan Result: 14

## 2023-07-07 NOTE — Progress Notes (Signed)
I,Dina M Abdulla,acting as a scribe for Vanna Scotland, MD.,have documented all relevant documentation on the behalf of Vanna Scotland, MD,as directed by  Vanna Scotland, MD while in the presence of Vanna Scotland, MD.   07/07/2023 6:31 PM   Chales Salmon 07-Oct-1986 [id number]  Referring provider: Lianne Moris, PA-C [address] [place],  [place] [address]  Chief Complaint  Patient presents with   Establish Care   Urinary Tract Infection    HPI: 37 year old female who presents today for further evaluation of UTIs pyelonephritis. She was seen and evaluated at the emergency department on 05/12/2015. There is some right flank pain.   Notably, she mentioned that she had 2 recent UTIs prior to that, initially treated with Macrobid and then Keflex. She ended up being diagnosed on this occasion with pyelonephritis and her antibiotics were switched to Cipro.  She did have a CT abdomen/pelvis with contrast and did show some right sided peripelvic and periorradial inflammatory fat stranding which was felt to be a sequela pf pyelonephritis. Her urinalysis was suspicious at the time and the urine culture grew E.coli, which was resistant to Bactrim, Gentamicin, and Ampicillin, but sensitive to Keflex.  Her urinalysis is negative.  She reports that she was on Keflex for 7 days, symptoms resolved for 5 days, then flank pain and chills onset, leading her to go to the ER.  She has been taking [place], which she believes is helpful.  No personal history of UTIs/ infections.    No issues with contraception.     Results for orders placed or performed in visit on 07/07/23  Microscopic Examination   Urine  Result Value Ref Range   WBC, UA 0-5 0 - 5 /hpf   RBC, Urine None seen 0 - 2 /hpf   Epithelial Cells (non renal) 0-10 0 - 10 /hpf   Bacteria, UA None seen None seen/Few  Urinalysis, Complete  Result Value Ref Range   Specific Gravity, UA <1.005 (L) 1.005 - 1.030   pH, UA 5.5 5.0 - 7.5    Color, UA Yellow Yellow   Appearance Ur Clear Clear   Leukocytes,UA Negative Negative   Protein,UA Negative Negative/Trace   Glucose, UA Negative Negative   Ketones, UA Negative Negative   RBC, UA Negative Negative   Bilirubin, UA Negative Negative   Urobilinogen, Ur 0.2 0.2 - 1.0 mg/dL   Nitrite, UA Negative Negative   Microscopic Examination See below:   Bladder Scan (Post Void Residual) in office  Result Value Ref Range   Scan Result 14 ml      PMH: Past Medical History:  Diagnosis Date   Hypertension    Skin cancer 2022   basal cell, actinic keratosis   Vitiligo     Surgical History: Past Surgical History:  Procedure Laterality Date   CESAREAN SECTION      Home Medications:  Allergies as of 07/07/2023       Reactions   Amoxicillin Hives        Medication List        Accurate as of July 07, 2023  6:31 PM. If you have any questions, ask your nurse or doctor.          b complex vitamins capsule Take 1 capsule by mouth daily.   COQ10 PO Take by mouth.   FISH OIL PO Take by mouth. 1400 mg   labetalol 200 MG tablet Commonly known as: NORMODYNE Take 200 mg by mouth 2 (two) times  daily.   montelukast 10 MG tablet Commonly known as: SINGULAIR Take 10 mg by mouth at bedtime.   multivitamin tablet Take 1 tablet by mouth daily.   PROBIOTIC PO Take by mouth.   tranexamic acid 650 MG Tabs tablet Commonly known as: Lysteda Take 2 tablets (1,300 mg total) by mouth 3 (three) times daily.   UNABLE TO FIND [place] defend and promote-probiotic   VITAMIN C PO Take by mouth.   VITAMIN D PO Take by mouth.        Allergies:  Allergies  Allergen Reactions   Amoxicillin Hives    Family History: Family History  Problem Relation Age of Onset   Hypertension Paternal Grandfather    Alcoholism Paternal Grandfather    Alcoholism Paternal Grandmother    Diabetes Maternal Grandmother    Liver disease Maternal Grandmother    Bladder Cancer  Maternal Grandfather    Hypertension Father    Hyperlipidemia Father    Hyperlipidemia Mother    Vasculitis Mother    Gestational diabetes Mother     Social History:  reports that she has never smoked. She has never used smokeless tobacco. She reports current alcohol use. She reports that she does not use drugs.   Physical Exam: BP (!) 162/88   Pulse 65   Ht 5\' 6"  (1.676 m)   Wt 172 lb 4 oz (78.1 kg)   BMI 27.80 kg/m   Constitutional:  Alert and oriented, No acute distress. HEENT: [place] AT, moist mucus membranes.  Trachea midline, no masses. Neurologic: Grossly intact, no focal deficits, moving all 4 extremities. Psychiatric: Normal mood and affect.  Laboratory Data:  Urinalysis Results for orders placed or performed in visit on 07/07/23  Microscopic Examination   Urine  Result Value Ref Range   WBC, UA 0-5 0 - 5 /hpf   RBC, Urine None seen 0 - 2 /hpf   Epithelial Cells (non renal) 0-10 0 - 10 /hpf   Bacteria, UA None seen None seen/Few  Urinalysis, Complete  Result Value Ref Range   Specific Gravity, UA <1.005 (L) 1.005 - 1.030   pH, UA 5.5 5.0 - 7.5   Color, UA Yellow Yellow   Appearance Ur Clear Clear   Leukocytes,UA Negative Negative   Protein,UA Negative Negative/Trace   Glucose, UA Negative Negative   Ketones, UA Negative Negative   RBC, UA Negative Negative   Bilirubin, UA Negative Negative   Urobilinogen, Ur 0.2 0.2 - 1.0 mg/dL   Nitrite, UA Negative Negative   Microscopic Examination See below:   Bladder Scan (Post Void Residual) in office  Result Value Ref Range   Scan Result 14 ml    IMPRESSION: 1. Mild right-sided peripelvic and periureteral inflammatory fat stranding which may represent sequelae associated with acute pyelonephritis. Correlation with urinalysis is recommended. 2. Bilateral simple renal cysts. No follow-up imaging is recommended. This recommendation follows ACR consensus guidelines: Management of the Incidental Renal Mass on CT: A  White Paper of the ACR Incidental Findings Committee. J Am Coll Radiol [phone].     Electronically Signed   By: Aram Candela M.D.   On: 05/12/2023 00:25   Personally reviewed the above imaging, agree.  Assessment & Plan:    Recurrent UTI/ pyelonephritis -Suspect incompletely treated infection rather than recurrent infections -PVR today 14 mL -Imaging otherwise unremarkable. -Recommended preventative supplements such as vaginal probiotics, D-mannose, and cranberry. -f/u as needed if she develops recurrent symptoms  Return if symptoms worsen or fail to improve.  I  have reviewed the above documentation for accuracy and completeness, and I agree with the above.   Vanna Scotland, MD  [facility] [address], [address] [place], [place] [address] [phone]

## 2023-07-30 ENCOUNTER — Ambulatory Visit
Admission: EM | Admit: 2023-07-30 | Discharge: 2023-07-30 | Disposition: A | Discharge status: Home / Self Care | Payer: Commercial Managed Care - PPO | Attending: Internal Medicine | Admitting: Internal Medicine

## 2023-07-30 DIAGNOSIS — J069 Acute upper respiratory infection, unspecified: Secondary | ICD-10-CM | POA: Diagnosis not present

## 2023-07-30 DIAGNOSIS — R5383 Other fatigue: Secondary | ICD-10-CM

## 2023-07-30 NOTE — ED Provider Notes (Signed)
[facility]    CSN: [id number] Arrival date & time: 07/30/23  3875      History   Chief Complaint No chief complaint on file.   HPI Haley Skinner is a 37 y.o. female comes to the urgent care with 3 days history of nasal congestion, runny nose and lightheadedness as well as fatigue.  Onset of symptoms was insidious and has been persistent.  Patient denies any fever, chills or bodyaches.  She has a history of seasonal allergies managed with Singulair.  No shortness of breath or wheezing.  No near-syncope or syncopal episodes.  She has tried NyQuil, DayQuil with no improvement in her symptoms.  No sick contacts.   HPI  Past Medical History:  Diagnosis Date   Hypertension    Skin cancer 2022   basal cell, actinic keratosis   Vitiligo     There are no problems to display for this patient.   Past Surgical History:  Procedure Laterality Date   CESAREAN SECTION      OB History     Gravida  2   Para  1   Term  1   Preterm      AB  1   Living  1      SAB  1   IAB      Ectopic      Multiple      Live Births  1            Home Medications    Prior to Admission medications   Medication Sig Start Date End Date Taking? Authorizing Provider  Ascorbic Acid (VITAMIN C PO) Take by mouth.    [provider]  b complex vitamins capsule Take 1 capsule by mouth daily.    [provider]  Coenzyme Q10 (COQ10 PO) Take by mouth.    [provider]  labetalol (NORMODYNE) 200 MG tablet Take 200 mg by mouth 2 (two) times daily. 05/11/22   [provider]  montelukast (SINGULAIR) 10 MG tablet Take 10 mg by mouth at bedtime.    [provider]  Multiple Vitamin (MULTIVITAMIN) tablet Take 1 tablet by mouth daily.    [provider]  Omega-3 Fatty Acids (FISH OIL PO) Take by mouth. 1400 mg    [provider]  Probiotic Product (PROBIOTIC PO) Take by mouth.    [provider]   tranexamic acid (LYSTEDA) 650 MG TABS tablet Take 2 tablets (1,300 mg total) by mouth 3 (three) times daily. 12/01/22   Lazaro Arms, MD  UNABLE TO FIND Darrold Junker defend and promote-probiotic    [provider]  VITAMIN D PO Take by mouth.    [provider]    Family History Family History  Problem Relation Age of Onset   Hypertension Paternal Grandfather    Alcoholism Paternal Grandfather    Alcoholism Paternal Grandmother    Diabetes Maternal Grandmother    Liver disease Maternal Grandmother    Bladder Cancer Maternal Grandfather    Hypertension Father    Hyperlipidemia Father    Hyperlipidemia Mother    Vasculitis Mother    Gestational diabetes Mother     Social History Social History   Tobacco Use   Smoking status: Never   Smokeless tobacco: Never  Vaping Use   Vaping status: Never Used  Substance Use Topics   Alcohol use: Yes    Comment: socially   Drug use: Never     Allergies   Amoxicillin  Review of Systems Review of Systems As per HPI  Physical Exam Triage Vital Signs ED Triage Vitals [07/30/23 0841]  Encounter Vitals Group     BP 134/87     Systolic BP Percentile      Diastolic BP Percentile      Pulse Rate 64     Resp 20     Temp 98.1 F (36.7 C)     Temp Source Oral     SpO2 97 %     Weight      Height      Head Circumference      Peak Flow      Pain Score 0     Pain Loc      Pain Education      Exclude from Growth Chart    No data found.  Updated Vital Signs BP 134/87 (BP Location: Right Arm)   Pulse 64   Temp 98.1 F (36.7 C) (Oral)   Resp 20   LMP 07/18/2023 Comment: start  SpO2 97%   Visual Acuity Right Eye Distance:   Left Eye Distance:   Bilateral Distance:    Right Eye Near:   Left Eye Near:    Bilateral Near:     Physical Exam Vitals and nursing note reviewed.  Constitutional:      General: She is not in acute distress.    Appearance: Normal appearance. She is not ill-appearing.  HENT:      Right Ear: Tympanic membrane normal.     Left Ear: Tympanic membrane normal.     Mouth/Throat:     Mouth: Mucous membranes are moist.     Pharynx: No posterior oropharyngeal erythema.  Cardiovascular:     Rate and Rhythm: Normal rate and regular rhythm.     Pulses: Normal pulses.     Heart sounds: Normal heart sounds.  Pulmonary:     Effort: Pulmonary effort is normal.     Breath sounds: Normal breath sounds.  Abdominal:     General: Bowel sounds are normal.     Palpations: Abdomen is soft.  Neurological:     Mental Status: She is alert.      UC Treatments / Results  Labs (all labs ordered are listed, but only abnormal results are displayed) Labs Reviewed - No data to display  EKG   Radiology No results found.  Procedures Procedures (including critical care time)  Medications Ordered in UC Medications - No data to display  Initial Impression / Assessment and Plan / UC Course  I have reviewed the triage vital signs and the nursing notes.  Pertinent labs & imaging results that were available during my care of the patient were reviewed by me and considered in my medical decision making (see chart for details).     1.  Acute viral illness with fatigue: COVID-19 testing is not indicated given the duration of her symptoms Supportive care: Maintain adequate hydration Tylenol or ibuprofen as needed for pain and/or fever Patient is advised to start using Flonase in addition to Singulair. Return precautions given Physical exam is unimpressive. Final Clinical Impressions(s) / UC Diagnoses   Final diagnoses:  Viral upper respiratory illness  Other fatigue     Discharge Instructions      Please increase oral fluid intake Please start using Flonase in addition to Singulair Humidifier and VapoRub use at bedtime will help with nasal congestion Your lung exam is unremarkable If you have worsening symptoms please return to urgent care to be  reevaluated You most  likely have a viral infection causing the fatigue and the fatigue will resolve in the next few to several days.   ED Prescriptions   None    PDMP not reviewed this encounter.   Merrilee Jansky, MD 07/30/23 [phone]

## 2023-07-30 NOTE — ED Triage Notes (Signed)
Pt reports she has nasal congestion, runny nose, on and off lightheadedness, sneezing,and is fatigue x 3 days.   Took sudafed PE, dayquil, and nyquil

## 2023-07-30 NOTE — Discharge Instructions (Signed)
Please increase oral fluid intake Please start using Flonase in addition to Singulair Humidifier and VapoRub use at bedtime will help with nasal congestion Your lung exam is unremarkable If you have worsening symptoms please return to urgent care to be reevaluated You most likely have a viral infection causing the fatigue and the fatigue will resolve in the next few to several days.

## 2023-12-27 ENCOUNTER — Other Ambulatory Visit: Payer: Self-pay | Admitting: Obstetrics & Gynecology
